# Patient Record
Sex: Female | Born: 1950 | Race: White | Hispanic: No | Marital: Married | Smoking: Never smoker
Health system: Southern US, Community
[De-identification: ages and names within clinical notes are randomized; demographics above are authoritative.]

## PROBLEM LIST (undated history)

## (undated) DIAGNOSIS — M1990 Unspecified osteoarthritis, unspecified site: Secondary | ICD-10-CM

## (undated) DIAGNOSIS — E782 Mixed hyperlipidemia: Secondary | ICD-10-CM

## (undated) DIAGNOSIS — E039 Hypothyroidism, unspecified: Secondary | ICD-10-CM

## (undated) DIAGNOSIS — Z8709 Personal history of other diseases of the respiratory system: Secondary | ICD-10-CM

## (undated) DIAGNOSIS — I10 Essential (primary) hypertension: Secondary | ICD-10-CM

## (undated) DIAGNOSIS — E559 Vitamin D deficiency, unspecified: Secondary | ICD-10-CM

## (undated) DIAGNOSIS — C801 Malignant (primary) neoplasm, unspecified: Secondary | ICD-10-CM

## (undated) DIAGNOSIS — E669 Obesity, unspecified: Secondary | ICD-10-CM

## (undated) DIAGNOSIS — Z79899 Other long term (current) drug therapy: Secondary | ICD-10-CM

## (undated) DIAGNOSIS — N950 Postmenopausal bleeding: Secondary | ICD-10-CM

## (undated) HISTORY — DX: Other long term (current) drug therapy: Z79.899

## (undated) HISTORY — DX: Hypothyroidism, unspecified: E03.9

## (undated) HISTORY — DX: Obesity, unspecified: E66.9

## (undated) HISTORY — DX: Postmenopausal bleeding: N95.0

## (undated) HISTORY — DX: Mixed hyperlipidemia: E78.2

## (undated) HISTORY — DX: Vitamin D deficiency, unspecified: E55.9

## (undated) HISTORY — PX: OTHER SURGICAL HISTORY: SHX169

## (undated) HISTORY — DX: Essential (primary) hypertension: I10

---

## 2000-12-14 HISTORY — PX: REPLACEMENT TOTAL KNEE: SUR1224

## 2013-12-18 ENCOUNTER — Encounter: Payer: Self-pay | Admitting: *Deleted

## 2013-12-18 DIAGNOSIS — E039 Hypothyroidism, unspecified: Secondary | ICD-10-CM | POA: Insufficient documentation

## 2013-12-25 ENCOUNTER — Encounter: Payer: Self-pay | Admitting: Gynecology

## 2013-12-25 NOTE — Progress Notes (Signed)
Patient left message and I called her back. She wanted to know what her cost would be for appt coming up. Advised her would not know now, it is based on what is done at the appt and what is coded. She has insurance but it is not effective till 01/14/14. I advised her to check and see if they will retro back, if not she can set up pmts with our billing, because she is self pay.

## 2013-12-29 ENCOUNTER — Encounter: Payer: Self-pay | Admitting: Gynecology

## 2013-12-29 ENCOUNTER — Ambulatory Visit: Payer: Self-pay | Attending: Gynecology | Admitting: Gynecology

## 2013-12-29 VITALS — BP 154/108 | HR 96 | Temp 98.4°F | Resp 18 | Ht 68.0 in | Wt 346.1 lb

## 2013-12-29 DIAGNOSIS — E782 Mixed hyperlipidemia: Secondary | ICD-10-CM | POA: Insufficient documentation

## 2013-12-29 DIAGNOSIS — E039 Hypothyroidism, unspecified: Secondary | ICD-10-CM | POA: Insufficient documentation

## 2013-12-29 DIAGNOSIS — C549 Malignant neoplasm of corpus uteri, unspecified: Secondary | ICD-10-CM | POA: Insufficient documentation

## 2013-12-29 DIAGNOSIS — C541 Malignant neoplasm of endometrium: Secondary | ICD-10-CM | POA: Insufficient documentation

## 2013-12-29 DIAGNOSIS — Z96659 Presence of unspecified artificial knee joint: Secondary | ICD-10-CM | POA: Insufficient documentation

## 2013-12-29 DIAGNOSIS — I10 Essential (primary) hypertension: Secondary | ICD-10-CM | POA: Insufficient documentation

## 2013-12-29 DIAGNOSIS — Z79899 Other long term (current) drug therapy: Secondary | ICD-10-CM | POA: Insufficient documentation

## 2013-12-29 NOTE — Patient Instructions (Signed)
Plan for surgery on 01/23/14 with Dr. Nancy Marus at [facility] will receive a phone call from pre-surgical testing to arrange for a pre-operative appointment one to two weeks before surgery.  Please call for any questions or concerns.

## 2013-12-29 NOTE — Progress Notes (Signed)
Consult Note: Gyn-Onc   Dawn Noble 63 y.o. female  Chief Complaint  Patient presents with  . Enometrial Adenocarcinoma    New Consult    Assessment : Grade 1 endometrial cancer  Plan: I recommend the patient be managed surgically with minimally invasive surgery (laparoscopically assisted robotic surgery). Pros and cons of this approach compared to open surgery were discussed with the patient and her husband. We will proceed to schedule surgery for February 10 with Dr. Paula Gehrig as the attending surgeon. The wrist surgery are reviewed and all questions are answered  HPI:  63-year-old white married female seen in consultation request of Dr. Eleanor Greene regarding management of a newly diagnosed grade 1 endometrial carcinoma. The patient started spotting in September and presented to see Dr. Green in December. Endometrial biopsy was obtained showing a grade 1 endometrial cancer. Endocervical curettage was negative. Sexual history gravida 3  Patient has no other gynecologic history.  Review of Systems:10 point review of systems is negative except as noted in interval history.   Vitals: Blood pressure 154/108, pulse 96, temperature 98.4 F (36.9 C), temperature source Oral, resp. rate 18, height 5' 8" (1.727 m), weight 346 lb 1.6 oz (156.99 kg).  Physical Exam: General : The patient is a healthy woman in no acute distress.  HEENT: normocephalic, extraoccular movements normal; neck is supple without thyromegally  Lynphnodes: Supraclavicular and inguinal nodes not enlarged  Abdomen: Morbidly obese Soft, non-tender, no ascites, no organomegally, no masses, no hernias  Pelvic:  EGBUS: Normal female  Vagina: Normal, no lesions  Urethra and Bladder: Normal, non-tender  Cervix: Normal Uterus: Difficult to outline due to patient's body habitus. (Ultrasound measurements are 10.3 x 5.3 x 6.0 cm.) Bi-manual examination: Non-tender; no adenxal masses or nodularity  Rectal: normal sphincter  tone, no masses, no blood  Lower extremities: No edema or varicosities. Normal range of motion      No Known Allergies  Past Medical History  Diagnosis Date  . Hypothyroidism   . Mixed hyperlipidemia   . Vitamin D deficiency   . Obesity   . Encounter for long-term (current) use of other medications   . Post-menopausal bleeding   . Hypertension     Past Surgical History  Procedure Laterality Date  . Replacement total knee Left   . Replacement total knee Right     Current Outpatient Prescriptions  Medication Sig Dispense Refill  . levothyroxine (SYNTHROID, LEVOTHROID) 175 MCG tablet Take 175 mcg by mouth daily before breakfast.      . naproxen sodium (ANAPROX) 220 MG tablet Take 220 mg by mouth as needed (for arthritis).       No current facility-administered medications for this visit.    History   Social History  . Marital Status: Married    Spouse Name: N/A    Number of Children: N/A  . Years of Education: N/A   Occupational History  . Not on file.   Social History Main Topics  . Smoking status: Never Smoker   . Smokeless tobacco: Never Used  . Alcohol Use: No  . Drug Use: Not on file  . Sexual Activity: Not on file   Other Topics Concern  . Not on file   Social History Narrative  . No narrative on file    Family History  Problem Relation Age of Onset  . Diabetes Brother   . Diabetes Maternal Grandmother   . Heart disease Maternal Grandmother   . Diabetes Paternal Grandmother         Alvino Chapel, MD 12/29/2013, 3:16 PM

## 2014-01-17 ENCOUNTER — Encounter (HOSPITAL_COMMUNITY): Payer: Self-pay | Admitting: Pharmacy Technician

## 2014-01-18 NOTE — Patient Instructions (Addendum)
[facility]  01/18/2014   Your procedure is scheduled on: Tuesday February 10th  Report to [facility] at 1100 AM.  Call this number if you have problems the morning of surgery [phone]   Remember: NO VISITORS UNDER AGE 65 DUE TO [place].   Do not eat food :After Midnight Sunday night, clear liquids all day Monday 01-22-2014, clear liquids until 730 am day of surgery 01-23-2014, then nothing by mouth.     Take these medicines the morning of surgery with A SIP OF WATER: levothyroxine                                [facility] [place]             You may not have any metal on your body including hair pins and piercings  Do not wear jewelry, make-up.  Do not wear lotions, powders, or perfumes. No  Deodorant is to be worn.   Men may shave face and neck.  Do not bring valuables to the hospital. [place].  Contacts, dentures or bridgework may not be worn into surgery.  Leave suitcase in the car. After surgery it may be brought to your room.  For patients admitted to the hospital, checkout time is 11:00 AM the day of discharge.    Please read over the following fact sheets that you were given: [facility] Preparing for surgery sheet,  incentive spirometer sheet, blood fact sheet  Call Paulette Blanch RN pre op nurse if needed [phone]    [place].  PATIENT SIGNATURE___________________________________________  NURSE SIGNATURE_____________________________________________

## 2014-01-19 ENCOUNTER — Ambulatory Visit (HOSPITAL_COMMUNITY)
Admission: RE | Admit: 2014-01-19 | Discharge: 2014-01-19 | Disposition: A | Discharge status: Home / Self Care | Payer: BC Managed Care – PPO | Source: Ambulatory Visit | Attending: Gynecologic Oncology | Admitting: Gynecologic Oncology

## 2014-01-19 ENCOUNTER — Encounter (HOSPITAL_COMMUNITY): Payer: Self-pay

## 2014-01-19 ENCOUNTER — Encounter (HOSPITAL_COMMUNITY)
Admission: RE | Admit: 2014-01-19 | Discharge: 2014-01-19 | Disposition: A | Discharge status: Home / Self Care | Payer: BC Managed Care – PPO | Source: Ambulatory Visit | Attending: Gynecologic Oncology | Admitting: Gynecologic Oncology

## 2014-01-19 DIAGNOSIS — Z01818 Encounter for other preprocedural examination: Secondary | ICD-10-CM | POA: Insufficient documentation

## 2014-01-19 DIAGNOSIS — Z01810 Encounter for preprocedural cardiovascular examination: Secondary | ICD-10-CM | POA: Insufficient documentation

## 2014-01-19 DIAGNOSIS — C549 Malignant neoplasm of corpus uteri, unspecified: Secondary | ICD-10-CM | POA: Insufficient documentation

## 2014-01-19 DIAGNOSIS — Z01812 Encounter for preprocedural laboratory examination: Secondary | ICD-10-CM | POA: Insufficient documentation

## 2014-01-19 HISTORY — DX: Personal history of other diseases of the respiratory system: Z87.09

## 2014-01-19 HISTORY — DX: Unspecified osteoarthritis, unspecified site: M19.90

## 2014-01-19 HISTORY — DX: Malignant (primary) neoplasm, unspecified: C80.1

## 2014-01-19 LAB — COMPREHENSIVE METABOLIC PANEL
ALT: 19 U/L (ref 0–35)
AST: 19 U/L (ref 0–37)
Albumin: 3.7 g/dL (ref 3.5–5.2)
Alkaline Phosphatase: 74 U/L (ref 39–117)
BUN: 15 mg/dL (ref 6–23)
CALCIUM: 9.5 mg/dL (ref 8.4–10.5)
CO2: 28 mEq/L (ref 19–32)
Chloride: 101 mEq/L (ref 96–112)
Creatinine, Ser: 0.81 mg/dL (ref 0.50–1.10)
GFR, EST AFRICAN AMERICAN: 87 mL/min — AB (ref >90)
GFR, EST NON AFRICAN AMERICAN: 75 mL/min — AB (ref >90)
GLUCOSE: 147 mg/dL — AB (ref 70–99)
Potassium: 5.2 mEq/L (ref 3.7–5.3)
Sodium: 139 mEq/L (ref 137–147)
TOTAL PROTEIN: 7.4 g/dL (ref 6.0–8.3)
Total Bilirubin: 0.5 mg/dL (ref 0.3–1.2)

## 2014-01-19 LAB — CBC WITH DIFFERENTIAL/PLATELET
BASOS ABS: 0 10*3/uL (ref 0.0–0.1)
Basophils Relative: 0 % (ref 0–1)
EOS ABS: 0.2 10*3/uL (ref 0.0–0.7)
EOS PCT: 3 % (ref 0–5)
HEMATOCRIT: 42.1 % (ref 36.0–46.0)
Hemoglobin: 13.6 g/dL (ref 12.0–15.0)
Lymphocytes Relative: 24 % (ref 12–46)
Lymphs Abs: 1.7 10*3/uL (ref 0.7–4.0)
MCH: 29.6 pg (ref 26.0–34.0)
MCHC: 32.3 g/dL (ref 30.0–36.0)
MCV: 91.5 fL (ref 78.0–100.0)
Monocytes Absolute: 0.6 10*3/uL (ref 0.1–1.0)
Monocytes Relative: 8 % (ref 3–12)
Neutro Abs: 4.8 10*3/uL (ref 1.7–7.7)
Neutrophils Relative %: 66 % (ref 43–77)
PLATELETS: 277 10*3/uL (ref 150–400)
RBC: 4.6 MIL/uL (ref 3.87–5.11)
RDW: 13.1 % (ref 11.5–15.5)
WBC: 7.4 10*3/uL (ref 4.0–10.5)

## 2014-01-20 LAB — ABO/RH: ABO/RH(D): A POS

## 2014-01-22 ENCOUNTER — Encounter (HOSPITAL_COMMUNITY): Payer: Self-pay | Admitting: Anesthesiology

## 2014-01-22 ENCOUNTER — Telehealth: Payer: Self-pay | Admitting: *Deleted

## 2014-01-22 MED ORDER — DEXTROSE 5 % IV SOLN
3.0000 g | INTRAVENOUS | Status: AC
  Administered 2014-01-23: 3 g via INTRAVENOUS
  Filled 2014-01-22 (×3): qty 3000

## 2014-01-22 NOTE — Anesthesia Preprocedure Evaluation (Signed)
Anesthesia Evaluation  Patient identified by MRN, date of birth, ID band Patient awake    Reviewed: Allergy & Precautions, H&P , NPO status , Patient's Chart, lab work & pertinent test results  Airway Mallampati: II TM Distance: >3 FB Neck ROM: Full    Dental no notable dental hx.    Pulmonary neg pulmonary ROS,  breath sounds clear to auscultation  Pulmonary exam normal       Cardiovascular hypertension, negative cardio ROS  Rhythm:Regular Rate:Normal     Neuro/Psych negative neurological ROS  negative psych ROS   GI/Hepatic negative GI ROS, Neg liver ROS,   Endo/Other  Hypothyroidism Morbid obesity  Renal/GU negative Renal ROS  negative genitourinary   Musculoskeletal negative musculoskeletal ROS (+)   Abdominal   Peds negative pediatric ROS (+)  Hematology negative hematology ROS (+)   Anesthesia Other Findings   Reproductive/Obstetrics negative OB ROS                           Anesthesia Physical Anesthesia Plan  ASA: III  Anesthesia Plan: General   Post-op Pain Management:    Induction: Intravenous  Airway Management Planned: Oral ETT  Additional Equipment:   Intra-op Plan:   Post-operative Plan: Extubation in OR  Informed Consent: I have reviewed the patients History and Physical, chart, labs and discussed the procedure including the risks, benefits and alternatives for the proposed anesthesia with the patient or authorized representative who has indicated his/her understanding and acceptance.   Dental advisory given  Plan Discussed with: CRNA  Anesthesia Plan Comments: (Due to morbid obesity, at increased risk for pulmonary complications. She may not tolerate head down position with abdominal insufflation.)        Anesthesia Quick Evaluation

## 2014-01-22 NOTE — Telephone Encounter (Signed)
Called pt with reminder to have clear liquids today, NPO after midnight. Pt verbalized understanding. No concerns at this time.

## 2014-01-22 NOTE — Telephone Encounter (Signed)
Pt surgery time moved to 0730. Pt to arrive at 0500. Pt verbalized understanding. Call to [place] in [place] scheduling pts surgery moved to 0730.

## 2014-01-23 ENCOUNTER — Inpatient Hospital Stay (HOSPITAL_COMMUNITY): Payer: BC Managed Care – PPO | Admitting: Anesthesiology

## 2014-01-23 ENCOUNTER — Ambulatory Visit (HOSPITAL_COMMUNITY)
Admission: RE | Admit: 2014-01-23 | Discharge: 2014-01-24 | Disposition: A | Discharge status: Home / Self Care | Payer: BC Managed Care – PPO | Source: Ambulatory Visit | Attending: Obstetrics & Gynecology | Admitting: Obstetrics & Gynecology

## 2014-01-23 ENCOUNTER — Encounter (HOSPITAL_COMMUNITY): Payer: BC Managed Care – PPO | Admitting: Anesthesiology

## 2014-01-23 ENCOUNTER — Encounter (HOSPITAL_COMMUNITY): Payer: Self-pay

## 2014-01-23 ENCOUNTER — Encounter (HOSPITAL_COMMUNITY)
Admission: RE | Disposition: A | Discharge status: Home / Self Care | Payer: Self-pay | Source: Ambulatory Visit | Attending: Obstetrics & Gynecology

## 2014-01-23 DIAGNOSIS — E782 Mixed hyperlipidemia: Secondary | ICD-10-CM | POA: Insufficient documentation

## 2014-01-23 DIAGNOSIS — C541 Malignant neoplasm of endometrium: Secondary | ICD-10-CM | POA: Diagnosis present

## 2014-01-23 DIAGNOSIS — E039 Hypothyroidism, unspecified: Secondary | ICD-10-CM | POA: Insufficient documentation

## 2014-01-23 DIAGNOSIS — Z79899 Other long term (current) drug therapy: Secondary | ICD-10-CM | POA: Insufficient documentation

## 2014-01-23 DIAGNOSIS — I10 Essential (primary) hypertension: Secondary | ICD-10-CM | POA: Insufficient documentation

## 2014-01-23 DIAGNOSIS — Z96659 Presence of unspecified artificial knee joint: Secondary | ICD-10-CM | POA: Insufficient documentation

## 2014-01-23 DIAGNOSIS — C549 Malignant neoplasm of corpus uteri, unspecified: Principal | ICD-10-CM | POA: Insufficient documentation

## 2014-01-23 HISTORY — PX: ROBOTIC ASSISTED TOTAL HYSTERECTOMY WITH BILATERAL SALPINGO OOPHERECTOMY: SHX6086

## 2014-01-23 LAB — TYPE AND SCREEN
ABO/RH(D): A POS
Antibody Screen: NEGATIVE

## 2014-01-23 SURGERY — ROBOTIC ASSISTED TOTAL HYSTERECTOMY WITH BILATERAL SALPINGO OOPHORECTOMY
Anesthesia: General | Site: Abdomen | Laterality: Bilateral

## 2014-01-23 MED ORDER — LIDOCAINE HCL (CARDIAC) 20 MG/ML IV SOLN
INTRAVENOUS | Status: AC
  Filled 2014-01-23: qty 5

## 2014-01-23 MED ORDER — NEOSTIGMINE METHYLSULFATE 1 MG/ML IJ SOLN
INTRAMUSCULAR | Status: DC | PRN
  Administered 2014-01-23: 5 mg via INTRAVENOUS

## 2014-01-23 MED ORDER — LEVOTHYROXINE SODIUM 175 MCG PO TABS
175.0000 ug | ORAL_TABLET | Freq: Every day | ORAL | Status: DC
  Administered 2014-01-24: 175 ug via ORAL
  Filled 2014-01-23 (×2): qty 1

## 2014-01-23 MED ORDER — MIDAZOLAM HCL 2 MG/2ML IJ SOLN
INTRAMUSCULAR | Status: AC
  Filled 2014-01-23: qty 2

## 2014-01-23 MED ORDER — ONDANSETRON HCL 4 MG/2ML IJ SOLN: 4.0000 mg | Freq: Four times a day (QID) | INTRAMUSCULAR | Status: DC | PRN

## 2014-01-23 MED ORDER — HYDROMORPHONE HCL PF 1 MG/ML IJ SOLN: 0.5000 mg | INTRAMUSCULAR | Status: DC | PRN

## 2014-01-23 MED ORDER — STERILE WATER FOR IRRIGATION IR SOLN
Status: DC | PRN
  Administered 2014-01-23: 3000 mL

## 2014-01-23 MED ORDER — ZOLPIDEM TARTRATE 5 MG PO TABS: 5.0000 mg | ORAL_TABLET | Freq: Every evening | ORAL | Status: DC | PRN

## 2014-01-23 MED ORDER — NEOSTIGMINE METHYLSULFATE 1 MG/ML IJ SOLN
INTRAMUSCULAR | Status: AC
  Filled 2014-01-23: qty 10

## 2014-01-23 MED ORDER — EPHEDRINE SULFATE 50 MG/ML IJ SOLN
INTRAMUSCULAR | Status: DC | PRN
  Administered 2014-01-23: 10 mg via INTRAVENOUS

## 2014-01-23 MED ORDER — MIDAZOLAM HCL 5 MG/5ML IJ SOLN
INTRAMUSCULAR | Status: DC | PRN
  Administered 2014-01-23: 2 mg via INTRAVENOUS

## 2014-01-23 MED ORDER — OXYCODONE-ACETAMINOPHEN 5-325 MG PO TABS
1.0000 | ORAL_TABLET | ORAL | Status: DC | PRN
Start: 2014-01-23 — End: 2014-01-24
  Administered 2014-01-24: 2 via ORAL
  Filled 2014-01-23: qty 2

## 2014-01-23 MED ORDER — LIDOCAINE HCL (CARDIAC) 20 MG/ML IV SOLN
INTRAVENOUS | Status: DC | PRN
  Administered 2014-01-23: 50 mg via INTRAVENOUS

## 2014-01-23 MED ORDER — KCL IN DEXTROSE-NACL 20-5-0.45 MEQ/L-%-% IV SOLN
INTRAVENOUS | Status: DC
  Administered 2014-01-23 – 2014-01-24 (×2): via INTRAVENOUS
  Filled 2014-01-23 (×3): qty 1000

## 2014-01-23 MED ORDER — HYDROMORPHONE HCL PF 2 MG/ML IJ SOLN
INTRAMUSCULAR | Status: AC
  Filled 2014-01-23: qty 1

## 2014-01-23 MED ORDER — ENOXAPARIN SODIUM 40 MG/0.4ML ~~LOC~~ SOLN
40.0000 mg | SUBCUTANEOUS | Status: AC
  Administered 2014-01-23: 40 mg via SUBCUTANEOUS
  Filled 2014-01-23: qty 0.4

## 2014-01-23 MED ORDER — GLYCOPYRROLATE 0.2 MG/ML IJ SOLN
INTRAMUSCULAR | Status: AC
  Filled 2014-01-23: qty 2

## 2014-01-23 MED ORDER — DEXAMETHASONE SODIUM PHOSPHATE 10 MG/ML IJ SOLN
INTRAMUSCULAR | Status: AC
  Filled 2014-01-23: qty 1

## 2014-01-23 MED ORDER — SUFENTANIL CITRATE 50 MCG/ML IV SOLN
INTRAVENOUS | Status: DC | PRN
  Administered 2014-01-23: 10 ug via INTRAVENOUS
  Administered 2014-01-23 (×2): 5 ug via INTRAVENOUS
  Administered 2014-01-23 (×3): 10 ug via INTRAVENOUS

## 2014-01-23 MED ORDER — DEXAMETHASONE SODIUM PHOSPHATE 10 MG/ML IJ SOLN
INTRAMUSCULAR | Status: DC | PRN
  Administered 2014-01-23: 5 mg via INTRAVENOUS

## 2014-01-23 MED ORDER — KETOROLAC TROMETHAMINE 15 MG/ML IJ SOLN
15.0000 mg | Freq: Four times a day (QID) | INTRAMUSCULAR | Status: AC
  Administered 2014-01-23 – 2014-01-24 (×4): 15 mg via INTRAVENOUS
  Filled 2014-01-23 (×5): qty 1

## 2014-01-23 MED ORDER — HYDROMORPHONE HCL PF 1 MG/ML IJ SOLN: 0.2500 mg | INTRAMUSCULAR | Status: DC | PRN

## 2014-01-23 MED ORDER — SUCCINYLCHOLINE CHLORIDE 20 MG/ML IJ SOLN
INTRAMUSCULAR | Status: DC | PRN
  Administered 2014-01-23: 100 mg via INTRAVENOUS

## 2014-01-23 MED ORDER — SODIUM CHLORIDE 0.9 % IJ SOLN
INTRAMUSCULAR | Status: AC
  Filled 2014-01-23: qty 20

## 2014-01-23 MED ORDER — HYDROMORPHONE HCL PF 1 MG/ML IJ SOLN
INTRAMUSCULAR | Status: DC | PRN
  Administered 2014-01-23 (×4): 0.5 mg via INTRAVENOUS

## 2014-01-23 MED ORDER — METOCLOPRAMIDE HCL 5 MG/ML IJ SOLN
INTRAMUSCULAR | Status: DC | PRN
  Administered 2014-01-23: 10 mg via INTRAVENOUS

## 2014-01-23 MED ORDER — ROCURONIUM BROMIDE 100 MG/10ML IV SOLN
INTRAVENOUS | Status: AC
  Filled 2014-01-23: qty 1

## 2014-01-23 MED ORDER — EPHEDRINE SULFATE 50 MG/ML IJ SOLN
INTRAMUSCULAR | Status: AC
  Filled 2014-01-23: qty 1

## 2014-01-23 MED ORDER — PROMETHAZINE HCL 25 MG/ML IJ SOLN: 6.2500 mg | INTRAMUSCULAR | Status: DC | PRN

## 2014-01-23 MED ORDER — KETOROLAC TROMETHAMINE 15 MG/ML IJ SOLN
15.0000 mg | Freq: Four times a day (QID) | INTRAMUSCULAR | Status: AC
  Filled 2014-01-23 (×4): qty 1

## 2014-01-23 MED ORDER — GLYCOPYRROLATE 0.2 MG/ML IJ SOLN
INTRAMUSCULAR | Status: DC | PRN
  Administered 2014-01-23: .8 mg via INTRAVENOUS

## 2014-01-23 MED ORDER — PROPOFOL 10 MG/ML IV BOLUS
INTRAVENOUS | Status: AC
  Filled 2014-01-23: qty 20

## 2014-01-23 MED ORDER — LACTATED RINGERS IV SOLN
INTRAVENOUS | Status: DC | PRN
  Administered 2014-01-23 (×2): via INTRAVENOUS

## 2014-01-23 MED ORDER — ONDANSETRON HCL 4 MG/2ML IJ SOLN
INTRAMUSCULAR | Status: AC
  Filled 2014-01-23: qty 2

## 2014-01-23 MED ORDER — LACTATED RINGERS IR SOLN
Status: DC | PRN
  Administered 2014-01-23: 1000 mL

## 2014-01-23 MED ORDER — ONDANSETRON HCL 4 MG PO TABS: 4.0000 mg | ORAL_TABLET | Freq: Four times a day (QID) | ORAL | Status: DC | PRN

## 2014-01-23 MED ORDER — ONDANSETRON HCL 4 MG/2ML IJ SOLN
INTRAMUSCULAR | Status: DC | PRN
  Administered 2014-01-23: 4 mg via INTRAVENOUS

## 2014-01-23 MED ORDER — SUFENTANIL CITRATE 50 MCG/ML IV SOLN
INTRAVENOUS | Status: AC
  Filled 2014-01-23: qty 1

## 2014-01-23 MED ORDER — PROPOFOL 10 MG/ML IV BOLUS
INTRAVENOUS | Status: DC | PRN
  Administered 2014-01-23: 200 mg via INTRAVENOUS

## 2014-01-23 MED ORDER — ROCURONIUM BROMIDE 100 MG/10ML IV SOLN
INTRAVENOUS | Status: DC | PRN
  Administered 2014-01-23: 20 mg via INTRAVENOUS
  Administered 2014-01-23: 15 mg via INTRAVENOUS
  Administered 2014-01-23: 5 mg via INTRAVENOUS
  Administered 2014-01-23: 30 mg via INTRAVENOUS

## 2014-01-23 SURGICAL SUPPLY — 65 items
BENZOIN TINCTURE PRP APPL 2/3 (GAUZE/BANDAGES/DRESSINGS) ×3 IMPLANT
CABLE HIGH FREQUENCY MONO STRZ (ELECTRODE) IMPLANT
CHLORAPREP W/TINT 26ML (MISCELLANEOUS) ×9 IMPLANT
CLOSURE WOUND 1/2 X4 (GAUZE/BANDAGES/DRESSINGS) ×1
CORDS BIPOLAR (ELECTRODE) ×3 IMPLANT
COVER MAYO STAND STRL (DRAPES) ×3 IMPLANT
COVER SURGICAL LIGHT HANDLE (MISCELLANEOUS) ×3 IMPLANT
COVER TIP SHEARS 8 DVNC (MISCELLANEOUS) ×1 IMPLANT
COVER TIP SHEARS 8MM DA VINCI (MISCELLANEOUS) ×2
DRAPE LG THREE QUARTER DISP (DRAPES) ×6 IMPLANT
DRAPE SURG IRRIG POUCH 19X23 (DRAPES) ×3 IMPLANT
DRAPE TABLE BACK 44X90 PK DISP (DRAPES) ×6 IMPLANT
DRAPE UTILITY XL STRL (DRAPES) ×3 IMPLANT
DRAPE WARM FLUID 44X44 (DRAPE) ×3 IMPLANT
DRSG TEGADERM 2-3/8X2-3/4 SM (GAUZE/BANDAGES/DRESSINGS) ×12 IMPLANT
DRSG TEGADERM 4X4.75 (GAUZE/BANDAGES/DRESSINGS) ×3 IMPLANT
DRSG TEGADERM 6X8 (GAUZE/BANDAGES/DRESSINGS) ×6 IMPLANT
ELECT REM PT RETURN 9FT ADLT (ELECTROSURGICAL) ×3
ELECTRODE REM PT RTRN 9FT ADLT (ELECTROSURGICAL) ×1 IMPLANT
GAUZE SPONGE 2X2 8PLY STRL LF (GAUZE/BANDAGES/DRESSINGS) ×1 IMPLANT
GAUZE SPONGE 4X4 16PLY XRAY LF (GAUZE/BANDAGES/DRESSINGS) ×3 IMPLANT
GLOVE BIO SURGEON STRL SZ 6.5 (GLOVE) ×6 IMPLANT
GLOVE BIO SURGEON STRL SZ7.5 (GLOVE) ×6 IMPLANT
GLOVE BIO SURGEONS STRL SZ 6.5 (GLOVE) ×3
GLOVE BIOGEL PI IND STRL 6.5 (GLOVE) ×1 IMPLANT
GLOVE BIOGEL PI IND STRL 7.0 (GLOVE) ×4 IMPLANT
GLOVE BIOGEL PI INDICATOR 6.5 (GLOVE) ×2
GLOVE BIOGEL PI INDICATOR 7.0 (GLOVE) ×8
GLOVE SS BIOGEL STRL SZ 7 (GLOVE) ×1 IMPLANT
GLOVE SUPERSENSE BIOGEL SZ 7 (GLOVE) ×2
GOWN STRL REIN XL XLG (GOWN DISPOSABLE) ×3 IMPLANT
GOWN STRL REUS W/ TWL XL LVL3 (GOWN DISPOSABLE) ×2 IMPLANT
GOWN STRL REUS W/TWL LRG LVL3 (GOWN DISPOSABLE) ×12 IMPLANT
GOWN STRL REUS W/TWL XL LVL3 (GOWN DISPOSABLE) ×4
HOLDER FOLEY CATH W/STRAP (MISCELLANEOUS) ×3 IMPLANT
KIT ACCESSORY DA VINCI DISP (KITS) ×2
KIT ACCESSORY DVNC DISP (KITS) ×1 IMPLANT
KIT BASIN OR (CUSTOM PROCEDURE TRAY) ×3 IMPLANT
MANIPULATOR UTERINE 4.5 ZUMI (MISCELLANEOUS) ×3 IMPLANT
OCCLUDER COLPOPNEUMO (BALLOONS) ×3 IMPLANT
POUCH SPECIMEN RETRIEVAL 10MM (ENDOMECHANICALS) IMPLANT
SET TUBE IRRIG SUCTION NO TIP (IRRIGATION / IRRIGATOR) ×3 IMPLANT
SHEET LAVH (DRAPES) ×3 IMPLANT
SOLUTION ANTI FOG 6CC (MISCELLANEOUS) ×3 IMPLANT
SOLUTION ELECTROLUBE (MISCELLANEOUS) ×3 IMPLANT
SPONGE GAUZE 2X2 STER 10/PKG (GAUZE/BANDAGES/DRESSINGS) ×2
SPONGE LAP 18X18 X RAY DECT (DISPOSABLE) IMPLANT
STRIP CLOSURE SKIN 1/2X4 (GAUZE/BANDAGES/DRESSINGS) ×2 IMPLANT
SUT VIC AB 0 CT1 27 (SUTURE) ×8
SUT VIC AB 0 CT1 27XBRD ANTBC (SUTURE) ×4 IMPLANT
SUT VIC AB 4-0 PS2 27 (SUTURE) ×6 IMPLANT
SUT VICRYL 0 UR6 27IN ABS (SUTURE) ×6 IMPLANT
SUT VLOC 180 0 24IN GS25 (SUTURE) ×3 IMPLANT
SUT VLOC 180 2-0 9IN GS21 (SUTURE) ×6 IMPLANT
SYR 50ML LL SCALE MARK (SYRINGE) ×3 IMPLANT
SYR BULB IRRIGATION 50ML (SYRINGE) IMPLANT
TOWEL OR 17X26 10 PK STRL BLUE (TOWEL DISPOSABLE) ×6 IMPLANT
TRAP SPECIMEN MUCOUS 40CC (MISCELLANEOUS) IMPLANT
TRAY FOLEY CATH 14FRSI W/METER (CATHETERS) ×3 IMPLANT
TRAY LAP CHOLE (CUSTOM PROCEDURE TRAY) ×3 IMPLANT
TROCAR 12M 150ML BLUNT (TROCAR) ×3 IMPLANT
TROCAR BLADELESS OPT 5 100 (ENDOMECHANICALS) ×3 IMPLANT
TROCAR XCEL 12X100 BLDLESS (ENDOMECHANICALS) ×3 IMPLANT
TUBING INSUFFLATION 10FT LAP (TUBING) ×3 IMPLANT
WATER STERILE IRR 1500ML POUR (IV SOLUTION) ×6 IMPLANT

## 2014-01-23 NOTE — Anesthesia Postprocedure Evaluation (Signed)
  Anesthesia Post-op Note  Patient: Dawn Noble  Procedure(s) Performed: Procedure(s) (LRB): ROBOTIC ASSISTED TOTAL HYSTERECTOMY WITH BILATERAL SALPINGO OOPHORECTOMY WITH POSSIBLE LYMPH NODE DISECTION (Bilateral)  Patient Location: PACU  Anesthesia Type: General  Level of Consciousness: awake and alert   Airway and Oxygen Therapy: Patient Spontanous Breathing  Post-op Pain: mild  Post-op Assessment: Post-op Vital signs reviewed, Patient's Cardiovascular Status Stable, Respiratory Function Stable, Patent Airway and No signs of Nausea or vomiting  Last Vitals:  Filed Vitals:   01/23/14 1125  BP:   Pulse: 68  Temp:   Resp: 11    Post-op Vital Signs: stable   Complications: No apparent anesthesia complications

## 2014-01-23 NOTE — H&P (View-Only) (Signed)
Consult Note: Gyn-Onc   Dawn Noble 63 y.o. female  Chief Complaint  Patient presents with  . Enometrial Adenocarcinoma    New Consult    Assessment : Grade 1 endometrial cancer  Plan: I recommend the patient be managed surgically with minimally invasive surgery (laparoscopically assisted robotic surgery). Pros and cons of this approach compared to open surgery were discussed with the patient and her husband. We will proceed to schedule surgery for February 10 with Dr. Ned Clines as the attending surgeon. The wrist surgery are reviewed and all questions are answered  HPI:  63 year old white married female seen in consultation request of Dr. Angela Cox regarding management of a newly diagnosed grade 1 endometrial carcinoma. The patient started spotting in September and presented to see Dr. Nyoka Cowden in December. Endometrial biopsy was obtained showing a grade 1 endometrial cancer. Endocervical curettage was negative. Sexual history gravida 3  Patient has no other gynecologic history.  Review of Systems:10 point review of systems is negative except as noted in interval history.   Vitals: Blood pressure 154/108, pulse 96, temperature 98.4 F (36.9 C), temperature source Oral, resp. rate 18, height 5\' 8"  (1.727 m), weight 346 lb 1.6 oz (156.99 kg).  Physical Exam: General : The patient is a healthy woman in no acute distress.  HEENT: normocephalic, extraoccular movements normal; neck is supple without thyromegally  Lynphnodes: Supraclavicular and inguinal nodes not enlarged  Abdomen: Morbidly obese Soft, non-tender, no ascites, no organomegally, no masses, no hernias  Pelvic:  EGBUS: Normal female  Vagina: Normal, no lesions  Urethra and Bladder: Normal, non-tender  Cervix: Normal Uterus: Difficult to outline due to patient's body habitus. (Ultrasound measurements are 10.3 x 5.3 x 6.0 cm.) Bi-manual examination: Non-tender; no adenxal masses or nodularity  Rectal: normal sphincter  tone, no masses, no blood  Lower extremities: No edema or varicosities. Normal range of motion      No Known Allergies  Past Medical History  Diagnosis Date  . Hypothyroidism   . Mixed hyperlipidemia   . Vitamin D deficiency   . Obesity   . Encounter for long-term (current) use of other medications   . Post-menopausal bleeding   . Hypertension     Past Surgical History  Procedure Laterality Date  . Replacement total knee Left   . Replacement total knee Right     Current Outpatient Prescriptions  Medication Sig Dispense Refill  . levothyroxine (SYNTHROID, LEVOTHROID) 175 MCG tablet Take 175 mcg by mouth daily before breakfast.      . naproxen sodium (ANAPROX) 220 MG tablet Take 220 mg by mouth as needed (for arthritis).       No current facility-administered medications for this visit.    History   Social History  . Marital Status: Married    Spouse Name: N/A    Number of Children: N/A  . Years of Education: N/A   Occupational History  . Not on file.   Social History Main Topics  . Smoking status: Never Smoker   . Smokeless tobacco: Never Used  . Alcohol Use: No  . Drug Use: Not on file  . Sexual Activity: Not on file   Other Topics Concern  . Not on file   Social History Narrative  . No narrative on file    Family History  Problem Relation Age of Onset  . Diabetes Brother   . Diabetes Maternal Grandmother   . Heart disease Maternal Grandmother   . Diabetes Paternal Grandmother  Alvino Chapel, MD 12/29/2013, 3:16 PM

## 2014-01-23 NOTE — Op Note (Signed)
PATIENT: Dawn Noble DATE OF BIRTH: 04/07/1951 ENCOUNTER DATE: 01/23/2014   Preop Diagnosis: Grade 1 endometrioid adenocarcinoma.   Postoperative Diagnosis: same.   Surgery: Total robotic hysterectomy bilateral salpingo-oophorectomy  Surgeons:  Imagene Gurney A. Alycia Rossetti, MD; Lahoma Crocker, MD   Anesthesia: General   Estimated blood loss:  200 ml   IVF: 2200 ml   Urine output: 161 ml   Complications: None   Pathology: Uterus, cervix, bilateral tubes and ovaries  Operative findings: Significant intraabdominal obesity and mesenteric fat. Difficult visualization secondary to pronounced sigmoid colon and large epiploica.  Normal appearing uterus and adnexa bilaterally. No frozen section performed as nodal dissection not feasible due to body habitus.  Procedure: The patient was identified in the preoperative holding area. Informed consent was signed on the chart. Patient was seen history was reviewed and exam was performed.   The patient was then taken to the operating room and placed in the supine position with SCD hose on. She was then placed in the dorsolithotomy position. Her arms were tucked at her side with appropriate precautions on the gel pad. General anesthesia was then induced without difficulty. Patient was secured to the bed with foam and tape. Tilt test was performed to ensure that there was no movement with Trendelenburg position. A OG-tube was placed to suction. First timeout was performed to confirm the patient, procedure, antibiotic, allergy status, estimated blood loss and OR time. The perineum was then prepped in the usual fashion with Betadine. A 14 French Foley was inserted into the bladder under sterile conditions. A sterile speculum was placed in the vagina. The cervix was without lesions. The cervix was grasped with a single-tooth tenaculum. The dilator without difficulty. A ZUMI with a medium Koe ring was placed without difficulty. The abdomen was then prepped with 2 Chlor  prep sponges per protocol.   Patient was then draped after the prep was dried. Second timeout was performed to confirm the above. After again confirming OG tube placement and it was to suction. A stab-wound was made in left upper quadrant 2 cm below the costal margin on the left in the midclavicular line. A 5 mm operative report was used to assure intra-abdominal placement. The abdomen was insufflated. At this point all points during the procedure the patient's intra-abdominal pressure was not increased over 15 mm of mercury. After insufflation was complete, the patient was placed in deep Trendelenburg position. 25 cm above the pubic symphysis that area was marked the camera port. Bilateral robotic ports were marked 10 cm from the midline incision at approximately 5 angle. Under direct visualization each of the trochars was placed into the abdomen. The small bowel was folded on its mesentery to allow improved visualization to the pelvis. The 5 mm LUQ port was then converted to a 10/12 port under direct visualization.  After assuring adequate visualization, the robot was then docked in the usual fashion. Under direct visualization the robotic instruments replaced.   The round ligament on the patient's right side was transected with monopolar cautery. The anterior and posterior leaves of the broad ligament were then taken down in the usual fashion. The ureter was identified on the medial leaf of the broad ligament. A window was made between the IP and the ureter. The IP was coagulated with bipolar cautery and transected. The posterior leaf of the broad ligament was taken down to the level of the KOH ring. The bladder flap was created using meticulous dissection and pinpoint cautery. The uterine vessels were coagulated with  bipolar cautery. The uterine vessels were then transected and the C loop was created. The same procedure was performed on the patient's left side.   The pneumo-occulder in the vagina was then  insufflated. The colpotomy was then created in the usual fashion. The specimen was then delivered to the vagina. Due to visualization a nodal assessment would not be possible therefore the uterus was not sent for frozen section as it would not alter our intraoperative management. The vaginal cuff was closed with a running 2.0 V-lock suture. The abdomen and pelvis were copiously irrigated and noted to be hemostatic. The robotic instruments were removed under direct visualization as were the robotic trochars. The pneumoperitoneum was removed. The patient was then taken out of the Trendelenburg position. Using of 0 Vicryl on a UR 6 needle the midline port subcutaneous tissue was closed after being grasped with allis clamps. The subcutaneous tissues of the port in the left upper quadrant was reapproximated. The skin was closed using 4-0 Vicryl. Steri-Strips and benzoin were applied. The pneumo occluded balloon was removed from the vagina. The vagina was swabbed and noted to have some bleeding. Speculum exam revealed a small sulcal tear on the right side. A figure of 8 suture with 0 vicryl was placed. This resulted in adequate hemostasis. The cuff was palpably intact as it could not be seen with the speculum due to the length of the vagina.  All instrument needle and Ray-Tec counts were correct x2. The patient tolerated the procedure well and was taken to the recovery room in stable condition. This is Nancy Marus dictating an operative note on patient Dawn Noble.

## 2014-01-23 NOTE — Interval H&P Note (Signed)
History and Physical Interval Note:  01/23/2014 7:33 AM  Dawn Noble  has presented today for surgery, with the diagnosis of endometrial cancer  The various methods of treatment have been discussed with the patient and family. After consideration of risks, benefits and other options for treatment, the patient has consented to  Procedure(s): ROBOTIC ASSISTED TOTAL HYSTERECTOMY WITH BILATERAL SALPINGO OOPHORECTOMY WITH POSSIBLE LYMPH NODE DISECTION (Bilateral) as a surgical intervention .  The patient's history has been reviewed, patient examined, no change in status, stable for surgery.  I have reviewed the patient's chart and labs.  Questions were answered to the patient's satisfaction.     Hagan A.

## 2014-01-23 NOTE — Transfer of Care (Signed)
Immediate Anesthesia Transfer of Care Note  Patient: Dawn Noble  Procedure(s) Performed: Procedure(s): ROBOTIC ASSISTED TOTAL HYSTERECTOMY WITH BILATERAL SALPINGO OOPHORECTOMY WITH POSSIBLE LYMPH NODE DISECTION (Bilateral)  Patient Location: PACU  Anesthesia Type:General  Level of Consciousness: awake, alert , oriented and patient cooperative  Airway & Oxygen Therapy: Patient Spontanous Breathing and Patient connected to face mask oxygen  Post-op Assessment: Report given to PACU RN and Post -op Vital signs reviewed and stable  Post vital signs: Reviewed and stable  Complications: No apparent anesthesia complications

## 2014-01-24 ENCOUNTER — Encounter (HOSPITAL_COMMUNITY): Payer: Self-pay | Admitting: Gynecologic Oncology

## 2014-01-24 LAB — CBC
HCT: 37.9 % (ref 36.0–46.0)
Hemoglobin: 12.3 g/dL (ref 12.0–15.0)
MCH: 29.7 pg (ref 26.0–34.0)
MCHC: 32.5 g/dL (ref 30.0–36.0)
MCV: 91.5 fL (ref 78.0–100.0)
PLATELETS: 283 10*3/uL (ref 150–400)
RBC: 4.14 MIL/uL (ref 3.87–5.11)
RDW: 13.2 % (ref 11.5–15.5)
WBC: 13.3 10*3/uL — AB (ref 4.0–10.5)

## 2014-01-24 LAB — BASIC METABOLIC PANEL
BUN: 13 mg/dL (ref 6–23)
CHLORIDE: 100 meq/L (ref 96–112)
CO2: 26 meq/L (ref 19–32)
Calcium: 8.6 mg/dL (ref 8.4–10.5)
Creatinine, Ser: 0.74 mg/dL (ref 0.50–1.10)
GFR calc Af Amer: >90 mL/min (ref >90)
GFR calc non Af Amer: 88 mL/min — ABNORMAL LOW (ref >90)
Glucose, Bld: 179 mg/dL — ABNORMAL HIGH (ref 70–99)
Potassium: 4.3 mEq/L (ref 3.7–5.3)
Sodium: 137 mEq/L (ref 137–147)

## 2014-01-24 MED ORDER — OXYCODONE-ACETAMINOPHEN 5-325 MG PO TABS: 1.0000 | ORAL_TABLET | ORAL | Status: AC | PRN

## 2014-01-24 NOTE — Discharge Instructions (Signed)
01/24/2014  Return to work: 4-6 weeks  Activity: 1. Be up and out of the bed during the day.  Take a nap if needed.  You may walk up steps but be careful and use the hand rail.  Stair climbing will tire you more than you think, you may need to stop part way and rest.   2. No lifting or straining for 6 weeks.  3. No driving for 1 week.  Do not drive if you are taking narcotic pain medicine.  4. Shower daily.  Use soap and water on your incision and pat dry; don't rub.   5. No sexual activity and nothing in the vagina for 8 weeks.  Diet: 1. Low sodium Heart Healthy Diet is recommended.  2. It is safe to use a laxative if you have difficulty moving your bowels.   Wound Care: 1. Keep clean and dry.  Shower daily.  Reasons to call the Doctor:  Fever - Oral temperature greater than 100.4 degrees Fahrenheit  Foul-smelling vaginal discharge  Difficulty urinating  Nausea and vomiting  Increased pain at the site of the incision that is unrelieved with pain medicine.  Difficulty breathing with or without chest pain  New calf pain especially if only on one side  Sudden, continuing increased vaginal bleeding with or without clots.   Contacts: For questions or concerns you should contact:  Dr. Lahoma Crocker at [phone]  Dr. Alycia Rossetti at [place]  Joylene John, NP at [phone]  Kegel Exercises The goal of Kegel exercises is to isolate and exercise your pelvic floor muscles. These muscles act as a hammock that supports the rectum, vagina, small intestine, and uterus. As the muscles weaken, the hammock sags and these organs are displaced from their normal positions. Kegel exercises can strengthen your pelvic floor muscles and help you to improve bladder and bowel control, improve sexual response, and help reduce many problems and some discomfort during pregnancy. Kegel exercises can be done anywhere and at any time. HOW TO PERFORM KEGEL EXERCISES 1. Locate your  pelvic floor muscles. To do this, squeeze (contract) the muscles that you use when you try to stop the flow of urine. You will feel a tightness in the vaginal area (women) and a tight lift in the rectal area (men and women). 2. When you begin, contract your pelvic muscles tight for 2 5 seconds, then relax them for 2 5 seconds. This is one set. Do 4 5 sets with a short pause in between. 3. Contract your pelvic muscles for 8 10 seconds, then relax them for 8 10 seconds. Do 4 5 sets. If you cannot contract your pelvic muscles for 8 10 seconds, try 5 7 seconds and work your way up to 8 10 seconds. Your goal is 4 5 sets of 10 contractions each day. Keep your stomach, buttocks, and legs relaxed during the exercises. Perform sets of both short and long contractions. Vary your positions. Perform these contractions 3 4 times per day. Perform sets while you are:   Lying in bed in the morning.  Standing at lunch.  Sitting in the late afternoon.  Lying in bed at night. You should do 40 50 contractions per day. Do not perform more Kegel exercises per day than recommended. Overexercising can cause muscle fatigue. Continue these exercises for for at least 15 20 weeks or as directed by your caregiver. Document Released: 11/16/2012 Document Reviewed: 11/16/2012 [facility] Patient Information 2014 [place], [place].  Acetaminophen; Oxycodone tablets What is this medicine?  ACETAMINOPHEN; OXYCODONE (a set a MEE noe fen; ox i KOE done) is a pain reliever. It is used to treat mild to moderate pain. This medicine may be used for other purposes; ask your health care provider or pharmacist if you have questions. COMMON BRAND NAME(S): Endocet, Magnacet, Narvox, Percocet, Perloxx, Primalev, Primlev, Roxicet, Xolox What should I tell my health care provider before I take this medicine? They need to know if you have any of these conditions: -brain tumor -Crohn's disease, inflammatory bowel disease, or ulcerative  colitis -drug abuse or addiction -head injury -heart or circulation problems -if you often drink alcohol -kidney disease or problems going to the bathroom -liver disease -lung disease, asthma, or breathing problems -an unusual or allergic reaction to acetaminophen, oxycodone, other opioid analgesics, other medicines, foods, dyes, or preservatives -pregnant or trying to get pregnant -breast-feeding How should I use this medicine? Take this medicine by mouth with a full glass of water. Follow the directions on the prescription label. Take your medicine at regular intervals. Do not take your medicine more often than directed. Talk to your pediatrician regarding the use of this medicine in children. Special care may be needed. Patients over 74 years old may have a stronger reaction and need a smaller dose. Overdosage: If you think you have taken too much of this medicine contact a poison control center or emergency room at once. NOTE: This medicine is only for you. Do not share this medicine with others. What if I miss a dose? If you miss a dose, take it as soon as you can. If it is almost time for your next dose, take only that dose. Do not take double or extra doses. What may interact with this medicine? -alcohol -antihistamines -barbiturates like amobarbital, butalbital, butabarbital, methohexital, pentobarbital, phenobarbital, thiopental, and secobarbital -benztropine -drugs for bladder problems like solifenacin, trospium, oxybutynin, tolterodine, hyoscyamine, and methscopolamine -drugs for breathing problems like ipratropium and tiotropium -drugs for certain stomach or intestine problems like propantheline, homatropine methylbromide, glycopyrrolate, atropine, belladonna, and dicyclomine -general anesthetics like etomidate, ketamine, nitrous oxide, propofol, desflurane, enflurane, halothane, isoflurane, and sevoflurane -medicines for depression, anxiety, or psychotic  disturbances -medicines for sleep -muscle relaxants -naltrexone -narcotic medicines (opiates) for pain -phenothiazines like perphenazine, thioridazine, chlorpromazine, mesoridazine, fluphenazine, prochlorperazine, promazine, and trifluoperazine -scopolamine -tramadol -trihexyphenidyl This list may not describe all possible interactions. Give your health care provider a list of all the medicines, herbs, non-prescription drugs, or dietary supplements you use. Also tell them if you smoke, drink alcohol, or use illegal drugs. Some items may interact with your medicine. What should I watch for while using this medicine? Tell your doctor or health care professional if your pain does not go away, if it gets worse, or if you have new or a different type of pain. You may develop tolerance to the medicine. Tolerance means that you will need a higher dose of the medication for pain relief. Tolerance is normal and is expected if you take this medicine for a long time. Do not suddenly stop taking your medicine because you may develop a severe reaction. Your body becomes used to the medicine. This does NOT mean you are addicted. Addiction is a behavior related to getting and using a drug for a non-medical reason. If you have pain, you have a medical reason to take pain medicine. Your doctor will tell you how much medicine to take. If your doctor wants you to stop the medicine, the dose will be slowly lowered over time to avoid  any side effects. You may get drowsy or dizzy. Do not drive, use machinery, or do anything that needs mental alertness until you know how this medicine affects you. Do not stand or sit up quickly, especially if you are an older patient. This reduces the risk of dizzy or fainting spells. Alcohol may interfere with the effect of this medicine. Avoid alcoholic drinks. There are different types of narcotic medicines (opiates) for pain. If you take more than one type at the same time, you may have  more side effects. Give your health care provider a list of all medicines you use. Your doctor will tell you how much medicine to take. Do not take more medicine than directed. Call emergency for help if you have problems breathing. The medicine will cause constipation. Try to have a bowel movement at least every 2 to 3 days. If you do not have a bowel movement for 3 days, call your doctor or health care professional. Do not take Tylenol (acetaminophen) or medicines that have acetaminophen with this medicine. Too much acetaminophen can be very dangerous. Many nonprescription medicines contain acetaminophen. Always read the labels carefully to avoid taking more acetaminophen. What side effects may I notice from receiving this medicine? Side effects that you should report to your doctor or health care professional as soon as possible: -allergic reactions like skin rash, itching or hives, swelling of the face, lips, or tongue -breathing difficulties, wheezing -confusion -light headedness or fainting spells -severe stomach pain -unusually weak or tired -yellowing of the skin or the whites of the eyes  Side effects that usually do not require medical attention (report to your doctor or health care professional if they continue or are bothersome): -dizziness -drowsiness -nausea -vomiting This list may not describe all possible side effects. Call your doctor for medical advice about side effects. You may report side effects to FDA at 1-800-FDA-1088. Where should I keep my medicine? Keep out of the reach of children. This medicine can be abused. Keep your medicine in a safe place to protect it from theft. Do not share this medicine with anyone. Selling or giving away this medicine is dangerous and against the law. Store at room temperature between 20 and 25 degrees C (68 and 77 degrees F). Keep container tightly closed. Protect from light. This medicine may cause accidental overdose and death if it is  taken by other adults, children, or pets. Flush any unused medicine down the toilet to reduce the chance of harm. Do not use the medicine after the expiration date. NOTE: This sheet is a summary. It may not cover all possible information. If you have questions about this medicine, talk to your doctor, pharmacist, or health care provider.  2014, Elsevier/Gold Standard. (2013-07-24 13:17:35)

## 2014-01-24 NOTE — Plan of Care (Signed)
Problem: Phase I Progression Outcomes Goal: Dangle/OOB as tolerated per MD order Outcome: Progressing Pt dangled and ambulated in the room independently.  Goal: VS, stable, temp < 100.4 degrees F Outcome: Progressing Pt vital signs stable and pt is afebrile.  Goal: I & O every 4 hrs or as ordered Outcome: Progressing Intake and Output is being measured and documented every 4 hours.  Problem: Phase II Progression Outcomes Goal: Pain controlled on oral analgesia Outcome: Progressing Pt pain controlled with oral pain medications.

## 2014-01-24 NOTE — Discharge Summary (Signed)
Physician Discharge Summary  Patient ID: Dawn Noble MRN: [id number] DOB/AGE: [birth year] 63 y.o.  Admit date: 01/23/2014 Discharge date: 01/24/2014  Admission Diagnoses: Endometrial cancer  Discharge Diagnoses:  Principal Problem:   Endometrial cancer   Discharged Condition:  The patient is in good condition and stable for discharge.    Hospital Course: On 01/23/2014, the patient underwent the following: Procedure(s): ROBOTIC ASSISTED TOTAL HYSTERECTOMY WITH BILATERAL SALPINGO OOPHORECTOMY WITH POSSIBLE LYMPH NODE DISECTION.  The postoperative course was uneventful.  She was discharged to home on postoperative day 1 tolerating a regular diet and passing flatus.  Consults: None  Significant Diagnostic Studies: None  Treatments: surgery: see above  Discharge Exam: Blood pressure 158/73, pulse 90, temperature 99.3 F (37.4 C), temperature source Oral, resp. rate 18, height 5\' 8"  (1.727 m), weight 345 lb (156.491 kg), SpO2 99.00%. General appearance: alert, cooperative and no distress Resp: clear to auscultation bilaterally Cardio: regular rate and rhythm, S1, S2 normal, no murmur, click, rub or gallop GI: soft, non-tender; bowel sounds normal; no masses,  no organomegaly and abdomen obese Extremities: extremities normal, atraumatic, no cyanosis or edema Incision/Wound: Lap sites with steri strips clean, dry, and intact  Disposition: Home  Discharge Orders   Future Appointments Provider Department Dept Phone   03/28/2014 10:45 AM Paola A. Alycia Rossetti, [facility] [phone]   Future Orders Complete By Expires   Call MD for:  difficulty breathing, headache or visual disturbances  As directed    Call MD for:  extreme fatigue  As directed    Call MD for:  hives  As directed    Call MD for:  persistant dizziness or light-headedness  As directed    Call MD for:  persistant nausea and vomiting  As directed    Call MD for:  redness, tenderness,  or signs of infection (pain, swelling, redness, odor or green/yellow discharge around incision site)  As directed    Call MD for:  severe uncontrolled pain  As directed    Call MD for:  temperature >100.4  As directed    Diet - low sodium heart healthy  As directed    Driving Restrictions  As directed    Comments:     No driving for 1 week.  Do not take narcotics and drive.   Increase activity slowly  As directed    Lifting restrictions  As directed    Comments:     No lifting greater than 10 lbs.   Sexual Activity Restrictions  As directed    Comments:     No sexual activity, nothing in the vagina, for 8 weeks.       Medication List         acetaminophen 325 MG tablet  Commonly known as:  TYLENOL  Take 650 mg by mouth every 6 (six) hours as needed.     levothyroxine 175 MCG tablet  Commonly known as:  SYNTHROID, LEVOTHROID  Take 175 mcg by mouth daily before breakfast.     naproxen sodium 220 MG tablet  Commonly known as:  ANAPROX  Take 220 mg by mouth as needed (for arthritis).     oxyCODONE-acetaminophen 5-325 MG per tablet  Commonly known as:  PERCOCET/ROXICET  Take 1-2 tablets by mouth every 4 (four) hours as needed for severe pain (moderate to severe pain).           Follow-up Information   Follow up with [facility] A., MD On 03/28/2014. (at 10:45 at the  Cancer Center)    Specialty:  Gynecologic Oncology   Contact information:   [address] [place] [id number] [phone]       Greater than thirty minutes were spend for face to face discharge instructions and discharge orders/summary in EPIC.   Signed: CROSS, MELISSA DEAL 01/24/2014, 9:07 AM

## 2014-01-26 ENCOUNTER — Telehealth: Payer: Self-pay | Admitting: Gynecologic Oncology

## 2014-01-26 NOTE — Telephone Encounter (Signed)
TC to patient regarding pathology. She will have a CT scan and if negative at least proceed with Vaginal cuff brachytherapy. PG

## 2014-01-30 ENCOUNTER — Other Ambulatory Visit: Payer: Self-pay | Admitting: Gynecologic Oncology

## 2014-01-30 DIAGNOSIS — C541 Malignant neoplasm of endometrium: Secondary | ICD-10-CM

## 2014-01-30 NOTE — Progress Notes (Signed)
Patient informed of upcoming CT scan.  She is to come and pick up her contrast.  She is advised to call for any questions or concerns.

## 2014-02-05 ENCOUNTER — Encounter (HOSPITAL_COMMUNITY): Payer: Self-pay

## 2014-02-05 ENCOUNTER — Ambulatory Visit (HOSPITAL_COMMUNITY)
Admission: RE | Admit: 2014-02-05 | Discharge: 2014-02-05 | Disposition: A | Discharge status: Home / Self Care | Payer: BC Managed Care – PPO | Source: Ambulatory Visit | Attending: Gynecologic Oncology | Admitting: Gynecologic Oncology

## 2014-02-05 DIAGNOSIS — C549 Malignant neoplasm of corpus uteri, unspecified: Secondary | ICD-10-CM | POA: Insufficient documentation

## 2014-02-05 DIAGNOSIS — C541 Malignant neoplasm of endometrium: Secondary | ICD-10-CM

## 2014-02-05 DIAGNOSIS — R599 Enlarged lymph nodes, unspecified: Secondary | ICD-10-CM | POA: Insufficient documentation

## 2014-02-05 MED ORDER — IOHEXOL 300 MG/ML  SOLN
125.0000 mL | Freq: Once | INTRAMUSCULAR | Status: AC | PRN
  Administered 2014-02-05: 125 mL via INTRAVENOUS

## 2014-02-07 ENCOUNTER — Telehealth: Payer: Self-pay | Admitting: Gynecologic Oncology

## 2014-02-07 NOTE — Telephone Encounter (Signed)
Spoke with patient about upcoming appointment at [facility] in [place] with Dr. Malva Limes.  Appointment on March 4 at 1:30pm.  Radiology contacted to have her CT sent to [facility].  Advised to call for any questions or concerns.  Dr. Alycia Rossetti is recommending pelvic and vaginal cuff radiation post-operatively and spoke with the patient this am.

## 2014-03-28 ENCOUNTER — Encounter: Payer: Self-pay | Admitting: Gynecologic Oncology

## 2014-03-28 ENCOUNTER — Ambulatory Visit: Payer: BC Managed Care – PPO | Attending: Gynecologic Oncology | Admitting: Gynecologic Oncology

## 2014-03-28 VITALS — BP 154/93 | HR 92 | Temp 98.3°F | Resp 16 | Ht 66.69 in | Wt 347.2 lb

## 2014-03-28 DIAGNOSIS — I10 Essential (primary) hypertension: Secondary | ICD-10-CM | POA: Insufficient documentation

## 2014-03-28 DIAGNOSIS — C541 Malignant neoplasm of endometrium: Secondary | ICD-10-CM

## 2014-03-28 DIAGNOSIS — C549 Malignant neoplasm of corpus uteri, unspecified: Secondary | ICD-10-CM | POA: Insufficient documentation

## 2014-03-28 DIAGNOSIS — E039 Hypothyroidism, unspecified: Secondary | ICD-10-CM | POA: Insufficient documentation

## 2014-03-28 DIAGNOSIS — E782 Mixed hyperlipidemia: Secondary | ICD-10-CM | POA: Insufficient documentation

## 2014-03-28 NOTE — Patient Instructions (Signed)
Return to see Dr. Alycia Rossetti 3-4 months.

## 2014-03-28 NOTE — Progress Notes (Signed)
Consult Note: Gyn-Onc  Dawn Noble 63 y.o. female  CC:  Chief Complaint  Patient presents with  . Endometrial Adenocarcinoma    HPI: 64 year old white married female seen in consultation request of Dr. Angela Cox regarding management of a newly diagnosed grade 1 endometrial carcinoma. The patient started spotting in September and presented to see Dr. Carlota Raspberry in December. Endometrial biopsy was obtained showing a grade 1 endometrial cancer. Endocervical curettage was negative. Sexual history gravida 3.  Interval History:  She underwent a robotic hysterectomy bilateral salpingo-oophorectomy February 10 of year 2015. We did not and the uterus for frozen section as it would've been no feasibility performing the nodal assessment secondary to her body habitus weight is 350 pounds.  Pathology: Uterus, cervix, bilateral tubes and ovaries  Operative findings: Significant intraabdominal obesity and mesenteric fat. Difficult visualization secondary to pronounced sigmoid colon and large epiploica. Normal appearing uterus and adnexa bilaterally. No frozen section performed as nodal dissection not feasible due to body habitus.  Final pathology revealed: Diagnosis Uterus +/- tubes/ovaries, neoplastic, with cervix - INVASIVE ENDOMETRIOID CARCINOMA, FIGO GRADE III, INVADING INTO OUTER HALF OF THE MYOMETRIUM. - CERVIX: BENIGN SQUAMOUS MUCOSA AND ENDOCERVICAL MUCOSA, NO DYSPLASIA OR MALIGNANCY. - BILATERAL OVARIES: BENIGN OVARIAN TISSUE, NO EVIDENCE OF ATYPIA OR MALIGNANCY. - BILATERAL FALLOPIAN TUBES: BENIGN FALLOPIAN TUBAL TISSUE, NO ATYPIA OR MALIGNANCY  Referred her to [place] in in [facility] for consideration of radiation therapy. A PET/CT was performed to followup on questionable marginal lymphadenopathy in the pelvis. The PET/CT revealed a 1.1 cm region in the area of the right external iliac node measuring an SUV max of 4.1. Based on that she was referred to Dr. Harlow Asa for chemotherapy. She's  had her first cycle of paclitaxel and carboplatin 3 weeks ago. We do not have records regarding this and did not know that this was her plan. The patient is very knowledgeable of her medical care.  She tolerated cycle #1 quite well. The biggest issue is the myalgias in her legs. We discussed taking Claritin for this. She states her medical oncologist similarly recommended this and she will start doing it. She had some burning in her legs and is using some aloe vera juice on them which is helped. She states she feels 100% since her surgery she never took any pain medication. She has any vaginal bleeding change about bladder habits. She's noticed an increased ankle swelling the last few days. After discussion it appears that she's had increased salt intake due to the Easter holiday and believes is most consistent with fluid retention secondary to salt.  Current Meds:  Outpatient Encounter Prescriptions as of 03/28/2014  Medication Sig  . levothyroxine (SYNTHROID, LEVOTHROID) 175 MCG tablet Take 175 mcg by mouth daily before breakfast.  . acetaminophen (TYLENOL) 325 MG tablet Take 650 mg by mouth every 6 (six) hours as needed.  . naproxen sodium (ANAPROX) 220 MG tablet Take 220 mg by mouth as needed (for arthritis).  . ondansetron (ZOFRAN) 8 MG tablet   . oxyCODONE (OXY IR/ROXICODONE) 5 MG immediate release tablet   . oxyCODONE-acetaminophen (PERCOCET/ROXICET) 5-325 MG per tablet Take 1-2 tablets by mouth every 4 (four) hours as needed for severe pain (moderate to severe pain).    Allergy: No Known Allergies  Social Hx:   History   Social History  . Marital Status: Married    Spouse Name: N/A    Number of Children: N/A  . Years of Education: N/A   Occupational History  . Not  on file.   Social History Main Topics  . Smoking status: Never Smoker   . Smokeless tobacco: Never Used  . Alcohol Use: No  . Drug Use: No  . Sexual Activity: Not on file   Other Topics Concern  . Not on file    Social History Narrative  . No narrative on file    Past Surgical Hx:  Past Surgical History  Procedure Laterality Date  . Replacement total knee Left 2002  . Replacement total knee Right 2002  . Bladder tumor removed    . Robotic assisted total hysterectomy with bilateral salpingo oopherectomy Bilateral 01/23/2014    Procedure: ROBOTIC ASSISTED TOTAL HYSTERECTOMY WITH BILATERAL SALPINGO OOPHORECTOMY WITH POSSIBLE LYMPH NODE DISECTION;  Surgeon: Imagene Gurney A. Alycia Rossetti, MD;  Location: [facility];  Service: Gynecology;  Laterality: Bilateral;    Past Medical Hx:  Past Medical History  Diagnosis Date  . Hypothyroidism   . Mixed hyperlipidemia     "borderline"  . Vitamin D deficiency   . Obesity   . Encounter for long-term (current) use of other medications   . Post-menopausal bleeding   . Hypertension   . H/O bronchitis   . Arthritis     in fingers  . endometrial ca dx;d 11/2013    invaded uterine wall    Oncology Hx:    Endometrial cancer   12/29/2013 Initial Diagnosis Endometrial cancer   01/23/2014 Surgery IB Grade 3 endometrial cancer. CT scan in [facility] with marginal node. PET scan with a 1.1 cm right sided node with SUV 4.   03/15/2014 -  Chemotherapy Cycle #1 paclitaxel and carboplatin    Family Hx:  Family History  Problem Relation Age of Onset  . Diabetes Brother   . Diabetes Maternal Grandmother   . Heart disease Maternal Grandmother   . Diabetes Paternal Grandmother     Vitals:  Blood pressure 154/93, pulse 92, temperature 98.3 F (36.8 C), resp. rate 16, height 5' 6.69" (1.694 m), weight 347 lb 3.2 oz (157.489 kg).  Physical Exam: Well-nourished well-developed female in no acute distress.  Abdomen: Well-healed laparoscopic incisions. Abdomen is soft and nontender.  Pelvic: Normal external female genitalia. Speculum examination reveals a well-healed vaginal cuff. Difficult to visualize and its entirety secondary to habitus. Bimanual examination reveals the  vaginal cuff to be intact. There is no tenderness or fluctuance.  Extremities: 1-2+ nonpitting edema equal bilaterally the ankles.  Assessment/Plan:  Very pleasant 63 year old with at least a stage IB grade 3 endometrioid adenocarcinoma. She is being treated as a stage IIIc 1 based on a positive lymph node on PET scan with an SUV max of 4. She scheduled for her second cycle of paclitaxel and carboplatin tomorrow. She's been treated a "sandwich" fashion. She's really been seen by Dr. Malva Limes in radiation oncology. Return to see [place] in 3-4 months. We will get records from her physicians in [facility]. Autumn Pruitt A. Alycia Rossetti, MD 03/28/2014, 11:14 AM

## 2014-06-28 ENCOUNTER — Ambulatory Visit: Payer: BC Managed Care – PPO | Attending: Gynecologic Oncology | Admitting: Gynecologic Oncology

## 2014-06-28 ENCOUNTER — Encounter: Payer: Self-pay | Admitting: Gynecologic Oncology

## 2014-06-28 VITALS — BP 157/91 | HR 85 | Temp 97.6°F | Resp 16 | Ht 66.0 in | Wt 347.4 lb

## 2014-06-28 DIAGNOSIS — C549 Malignant neoplasm of corpus uteri, unspecified: Secondary | ICD-10-CM

## 2014-06-28 DIAGNOSIS — C541 Malignant neoplasm of endometrium: Secondary | ICD-10-CM

## 2014-06-28 NOTE — Progress Notes (Signed)
Pt getting Chemo at [facility]

## 2014-06-28 NOTE — Patient Instructions (Addendum)
Return to clinic in 6 months. Follow up with your medical oncologist and radiation oncologist in the interim.

## 2014-06-28 NOTE — Progress Notes (Signed)
Consult Note: Gyn-Onc  Dawn Noble 63 y.o. female  CC:  Chief Complaint  Patient presents with  . Endometrial Adenocarcinoma    HPI: 63 year old white married female seen in consultation request of Dr. Angela Cox regarding management of a newly diagnosed grade 1 endometrial carcinoma. The patient started spotting in September and presented to see Dr. Carlota Raspberry in December. Endometrial biopsy was obtained showing a grade 1 endometrial cancer. Endocervical curettage was negative. Sexual history gravida 3.  Interval History:  She underwent a robotic hysterectomy bilateral salpingo-oophorectomy February 10 of year 2015. We did not and the uterus for frozen section as it would've been no feasibility performing the nodal assessment secondary to her body habitus weight is 350 pounds.  Pathology: Uterus, cervix, bilateral tubes and ovaries  Operative findings: Significant intraabdominal obesity and mesenteric fat. Difficult visualization secondary to pronounced sigmoid colon and large epiploica. Normal appearing uterus and adnexa bilaterally. No frozen section performed as nodal dissection not feasible due to body habitus.  Final pathology revealed: Diagnosis Uterus +/- tubes/ovaries, neoplastic, with cervix - INVASIVE ENDOMETRIOID CARCINOMA, FIGO GRADE III, INVADING INTO OUTER HALF OF THE MYOMETRIUM. - CERVIX: BENIGN SQUAMOUS MUCOSA AND ENDOCERVICAL MUCOSA, NO DYSPLASIA OR MALIGNANCY. - BILATERAL OVARIES: BENIGN OVARIAN TISSUE, NO EVIDENCE OF ATYPIA OR MALIGNANCY. - BILATERAL FALLOPIAN TUBES: BENIGN FALLOPIAN TUBAL TISSUE, NO ATYPIA OR MALIGNANCY  Referred her to [facility] in in [facility] for consideration of radiation therapy. A PET/CT was performed to followup on questionable marginal lymphadenopathy in the pelvis. The PET/CT revealed a 1.1 cm region in the area of the right external iliac node measuring an SUV max of 4.1. Based on that she was referred to Dr. Harlow Asa for chemotherapy. She's  had her first 3 cycles of paclitaxel and carboplatin and states her last in 25 fractions of external beam radiation. She did have a PET scan between her chemotherapy and her radiation and per her report the lymph nodes were slightly larger. There was performed only about a week after she completed her chemotherapy and she does not know any numbers regarding the activity of those lymph nodes on PET scan. We do not have any records regarding that. With radiation she's tolerated that well. She's had some loose stools which is well-controlled with Imodium. She's had a few episodes of nausea controlled with antibiotics. She describes her energy level is good but she does fatigue easy year. 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 She did suffer a fall leaving radiation oncology where she tripped on her shoe. She had some bruising on her knees. X-rays were negative for any trauma. She has not fallen since normal for that time.  Review of Systems  Constitutional: As above. Denies fever. Skin: No rash Cardiovascular: No chest pain, shortness of breath, + edema in her feet Pulmonary: No cough  Gastro Intestinal: No pain. Occ nausea, no vomiting, occ diarrhea reported.  Genitourinary: No frequency, urgency, or dysuria.  Denies vaginal bleeding and discharge. + eneuresis Musculoskeletal: + bruises on knees from fall Neurologic: Grade 1 neuropathy toes Psychology: Doing well      Current Meds:  Outpatient Encounter Prescriptions as of 06/28/2014  Medication Sig  . furosemide (LASIX) 20 MG tablet   . levothyroxine (SYNTHROID, LEVOTHROID) 175 MCG tablet Take 175 mcg by mouth daily before breakfast.  . acetaminophen (TYLENOL) 325 MG tablet Take 650 mg by mouth every 6 (six) hours as needed.  . naproxen sodium (ANAPROX) 220 MG tablet Take 220 mg  by mouth as needed (for arthritis).  . ondansetron (ZOFRAN) 8 MG tablet   . oxyCODONE (OXY IR/ROXICODONE) 5 MG immediate release tablet   .  oxyCODONE-acetaminophen (PERCOCET/ROXICET) 5-325 MG per tablet Take 1-2 tablets by mouth every 4 (four) hours as needed for severe pain (moderate to severe pain).    Allergy: No Known Allergies  Social Hx:   History   Social History  . Marital Status: Married    Spouse Name: N/A    Number of Children: N/A  . Years of Education: N/A   Occupational History  . Not on file.   Social History Main Topics  . Smoking status: Never Smoker   . Smokeless tobacco: Never Used  . Alcohol Use: No  . Drug Use: No  . Sexual Activity: Not on file   Other Topics Concern  . Not on file   Social History Narrative  . No narrative on file    Past Surgical Hx:  Past Surgical History  Procedure Laterality Date  . Replacement total knee Left 2002  . Replacement total knee Right 2002  . Bladder tumor removed    . Robotic assisted total hysterectomy with bilateral salpingo oopherectomy Bilateral 01/23/2014    Procedure: ROBOTIC ASSISTED TOTAL HYSTERECTOMY WITH BILATERAL SALPINGO OOPHORECTOMY WITH POSSIBLE LYMPH NODE DISECTION;  Surgeon: Imagene Gurney A. Alycia Rossetti, MD;  Location: [facility];  Service: Gynecology;  Laterality: Bilateral;    Past Medical Hx:  Past Medical History  Diagnosis Date  . Hypothyroidism   . Mixed hyperlipidemia     "borderline"  . Vitamin D deficiency   . Obesity   . Encounter for long-term (current) use of other medications   . Post-menopausal bleeding   . Hypertension   . H/O bronchitis   . Arthritis     in fingers  . endometrial ca dx;d 11/2013    invaded uterine wall    Oncology Hx:    Endometrial cancer   12/29/2013 Initial Diagnosis Endometrial cancer   01/23/2014 Surgery IB Grade 3 endometrial cancer. CT scan in [facility] with marginal node. PET scan with a 1.1 cm right sided node with SUV 4.   03/15/2014 -  Chemotherapy Cycle #1 paclitaxel and carboplatin for sandwich s/p 6 cycles total. Will start cycle #4 in 3-4 weeks.    - 06/28/2014 Radiation Therapy external beam  radiation    Family Hx:  Family History  Problem Relation Age of Onset  . Diabetes Brother   . Diabetes Maternal Grandmother   . Heart disease Maternal Grandmother   . Diabetes Paternal Grandmother     Vitals:  Blood pressure 157/91, pulse 85, temperature 97.6 F (36.4 C), temperature source Oral, resp. rate 16, height 5\' 6"  (1.676 m), weight 347 lb 6.4 oz (157.58 kg).  Physical Exam: Well-nourished well-developed female in no acute distress.  Neck: No lymphadenopathy, no thyromegaly.  Lungs: Clear to auscultation bilaterally with distant breath sounds.  Cardiovascular: Regular rate and rhythm  Abdomen: Well-healed laparoscopic incisions. Abdomen is soft and nontender. Tattoo marks from radiation therapy. The skin is intact.  Groins: No lymphadenopathy. Exam is limited.  Extremities: 1+ nonpitting edema equal bilaterally. Chronic venous stasis changes on the anterior lower extremity. Bruising around her knees bilaterally  Pelvic: Normal external female genitalia. Speculum examination reveals a well-healed vaginal cuff. Difficult to visualize in its entirety secondary to habitus. Bimanual examination reveals no masses or nodularity. Exam is limited by habitus.   Assessment/Plan:  Very pleasant 63 year old with at least a stage IB grade  3 endometrioid adenocarcinoma. She is being treated as a stage IIIc 1 based on a positive lymph node on PET scan with an SUV max of 4. She has completed her first 3 cycles of chemotherapy. Per report a PET scan done after the first 3 cycles (only one week after the last cycle of chemotherapy) showed an increase in size of the lymph nodes that was slight. She is completing her last of 25 fractions of radiation therapy today. She has followup with Dr. half next week. You told her she had 3-4 weeks between radiation and the starting of chemotherapy the plan is an additional 3 cycles of chemotherapy. I would recommend [place] PET/CT after that sixth total  cycle of chemotherapy however I would extend the time between the chemotherapy to the PET scan. I will see her when she completes her therapy with the results of the PET scan.  Her questions were elicited in answer to her satisfaction. She is to contact me should there be a delay in treatment so we can appropriately schedule her appointment. We will request records from [facility]. Tiona Ruane A., MD 06/28/2014, 10:06 AM

## 2015-05-17 IMAGING — CR DG CHEST 2V
2 series · 2 of 2 positions shown · non-contrast
Comparison: None.

CLINICAL DATA: Current history of endometrial cancer. Preoperative
respiratory evaluation prior to hysterectomy.

EXAM:
CHEST  2 VIEW

[w chest pa]
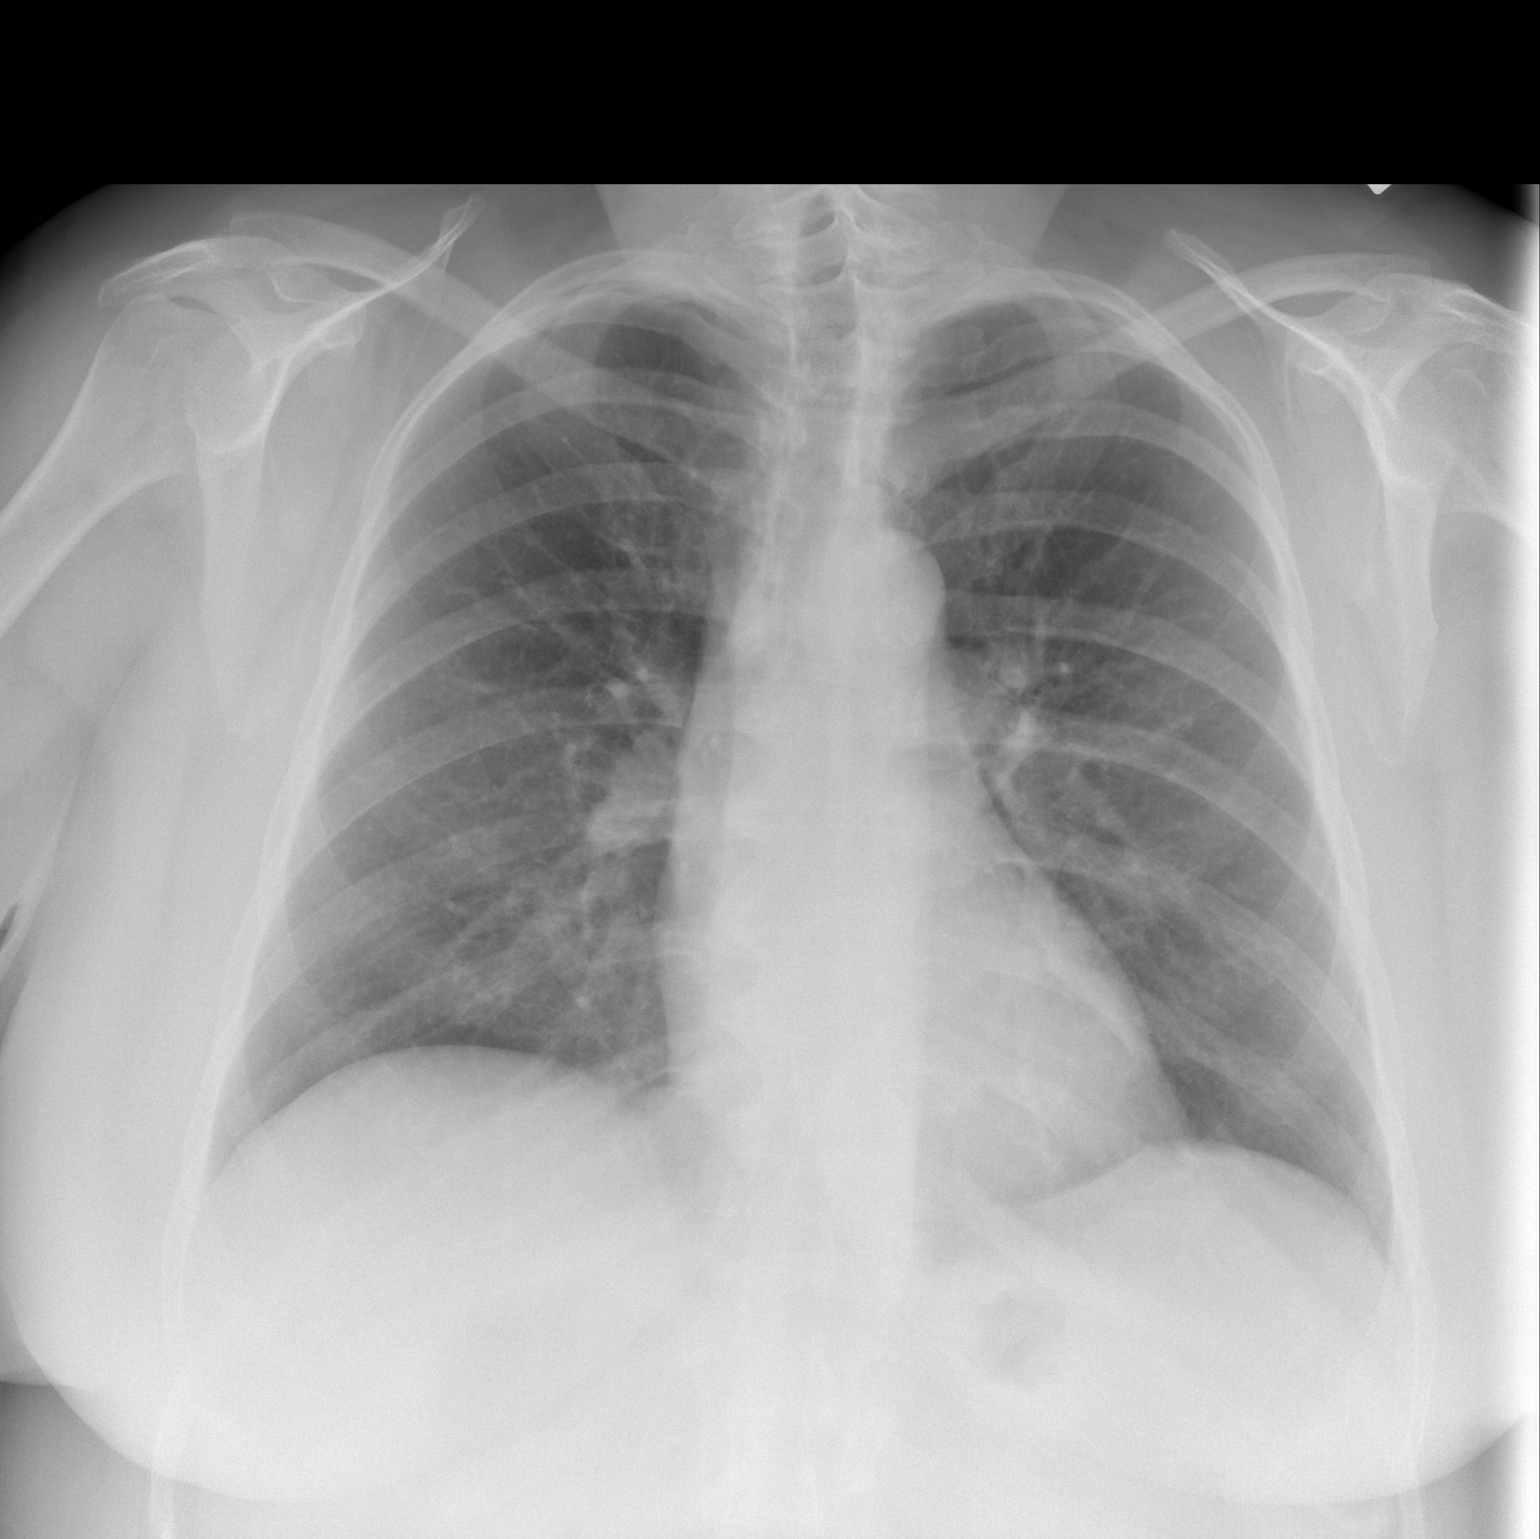

[w chest lat]
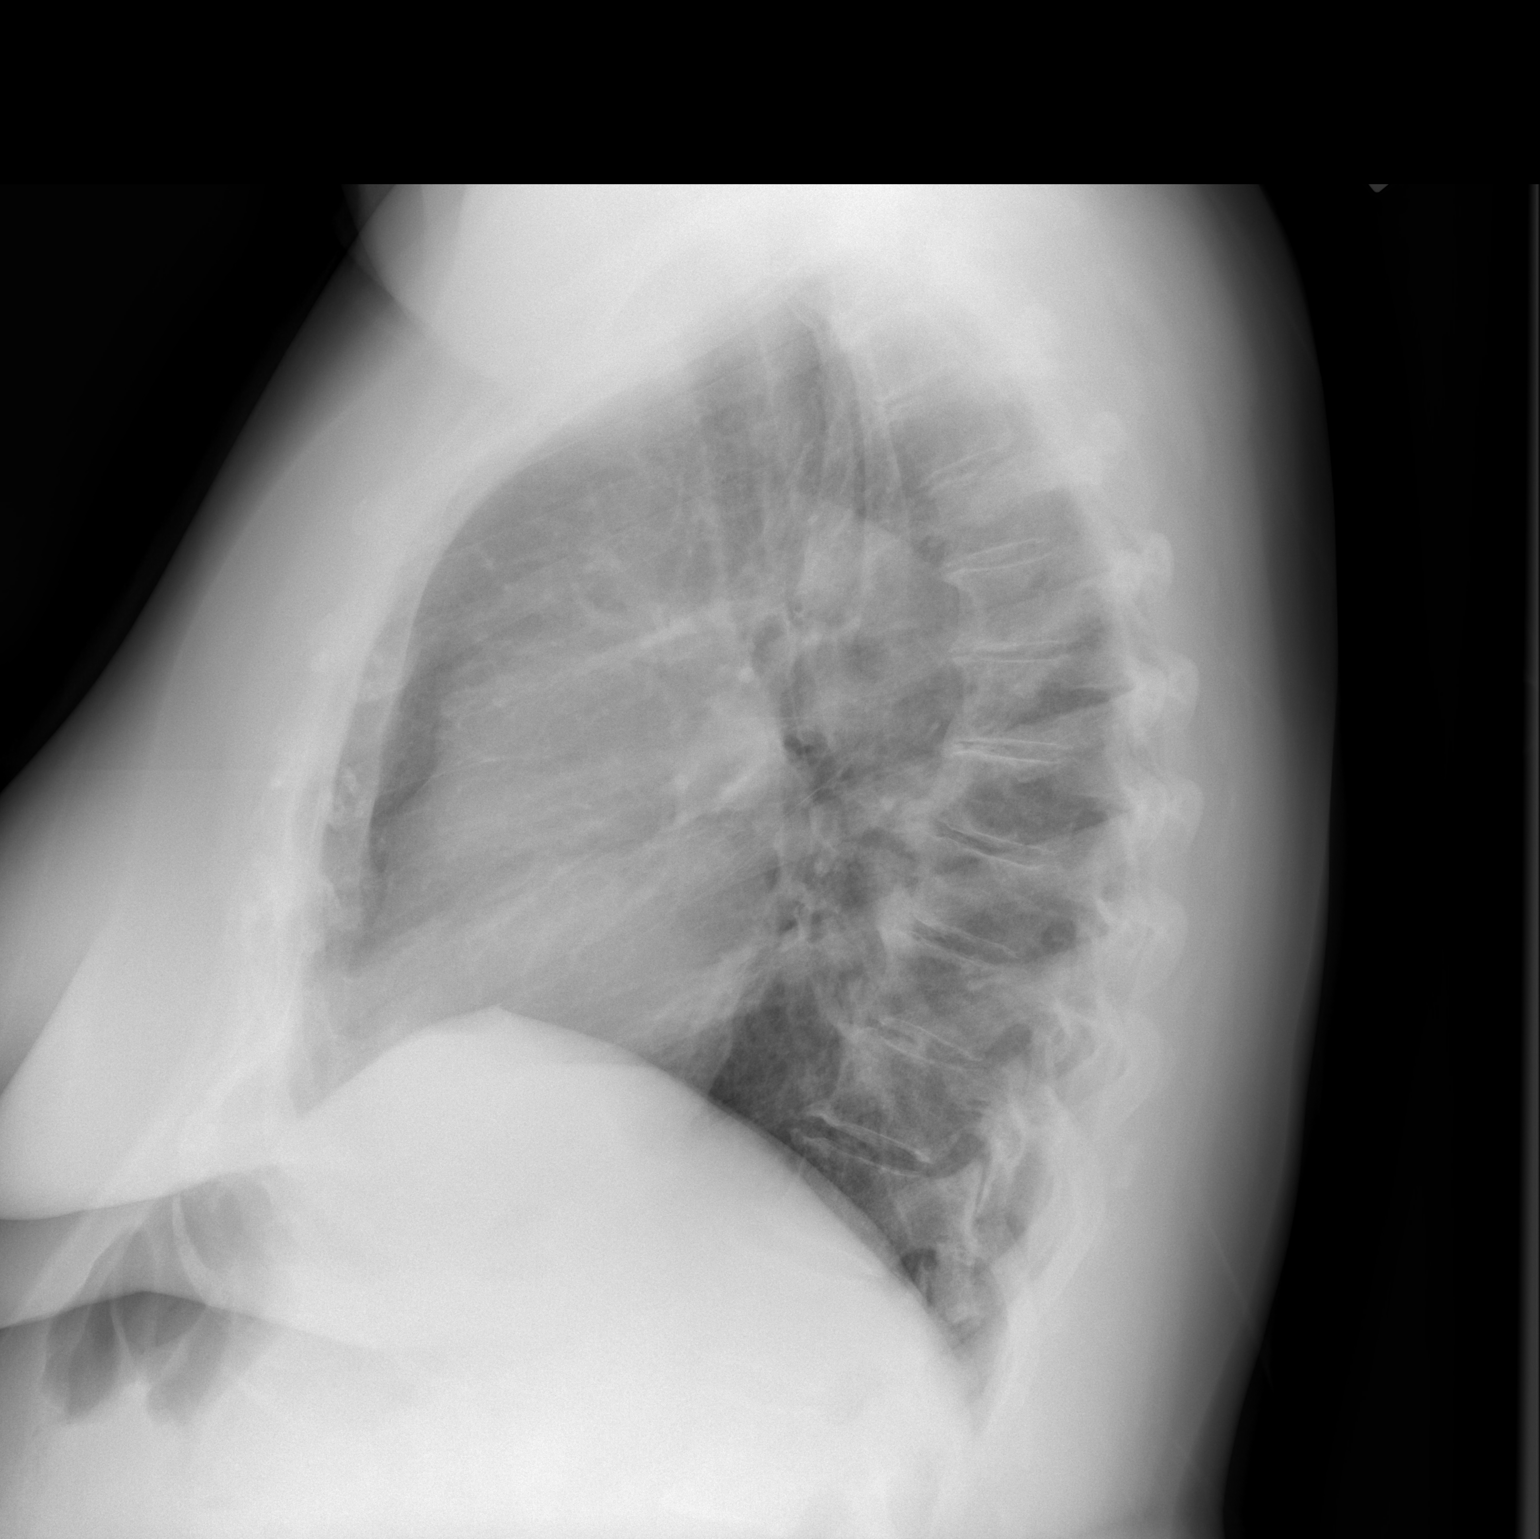

[2 of 2 positions shown; findings below may reference images not displayed]

FINDINGS: Cardiomediastinal silhouette unremarkable. Lungs clear.
Bronchovascular markings normal. Pulmonary vascularity normal. No
visible pleural effusions. No pneumothorax. Degenerative changes
involving the thoracic spine.
IMPRESSION: No acute cardiopulmonary disease.
